# Patient Record
Sex: Female | Born: 1970 | Race: Black or African American | Hispanic: No | Marital: Single | Smoking: Never smoker
Health system: Southern US, Community
[De-identification: ages and names within clinical notes are randomized; demographics above are authoritative.]

## PROBLEM LIST (undated history)

## (undated) DIAGNOSIS — M419 Scoliosis, unspecified: Secondary | ICD-10-CM

## (undated) DIAGNOSIS — R011 Cardiac murmur, unspecified: Secondary | ICD-10-CM

## (undated) DIAGNOSIS — G808 Other cerebral palsy: Secondary | ICD-10-CM

---

## 1998-03-16 ENCOUNTER — Encounter (HOSPITAL_COMMUNITY): Admission: RE | Admit: 1998-03-16 | Discharge: 1998-06-14 | Payer: Self-pay | Admitting: Family Medicine

## 2000-07-13 ENCOUNTER — Ambulatory Visit (HOSPITAL_COMMUNITY): Admission: RE | Admit: 2000-07-13 | Discharge: 2000-07-13 | Payer: Self-pay | Admitting: Family Medicine

## 2000-07-13 ENCOUNTER — Encounter: Payer: Self-pay | Admitting: Family Medicine

## 2004-12-17 ENCOUNTER — Emergency Department (HOSPITAL_COMMUNITY): Admission: EM | Admit: 2004-12-17 | Discharge: 2004-12-17 | Payer: Self-pay | Admitting: Emergency Medicine

## 2007-12-19 ENCOUNTER — Encounter: Admission: RE | Admit: 2007-12-19 | Discharge: 2007-12-19 | Payer: Self-pay | Admitting: Family Medicine

## 2012-01-23 ENCOUNTER — Ambulatory Visit (HOSPITAL_COMMUNITY)
Admission: RE | Admit: 2012-01-23 | Discharge: 2012-01-23 | Disposition: A | Discharge status: Home / Self Care | Payer: Medicaid Other | Source: Ambulatory Visit | Attending: Family Medicine | Admitting: Family Medicine

## 2012-01-23 DIAGNOSIS — R011 Cardiac murmur, unspecified: Secondary | ICD-10-CM

## 2012-01-23 DIAGNOSIS — G809 Cerebral palsy, unspecified: Secondary | ICD-10-CM | POA: Insufficient documentation

## 2012-01-23 NOTE — Progress Notes (Signed)
*  PRELIMINARY RESULTS* Echocardiogram 2D Echocardiogram has been performed.  Sheri Crosby 01/23/2012, 9:57 AM

## 2012-08-13 ENCOUNTER — Emergency Department (HOSPITAL_COMMUNITY)
Admission: EM | Admit: 2012-08-13 | Discharge: 2012-08-14 | Disposition: A | Discharge status: Home / Self Care | Payer: Medicaid Other | Attending: Emergency Medicine | Admitting: Emergency Medicine

## 2012-08-13 ENCOUNTER — Emergency Department (HOSPITAL_COMMUNITY): Payer: Medicaid Other

## 2012-08-13 ENCOUNTER — Encounter (HOSPITAL_COMMUNITY): Payer: Self-pay | Admitting: *Deleted

## 2012-08-13 DIAGNOSIS — Z79899 Other long term (current) drug therapy: Secondary | ICD-10-CM | POA: Insufficient documentation

## 2012-08-13 DIAGNOSIS — S8011XA Contusion of right lower leg, initial encounter: Secondary | ICD-10-CM

## 2012-08-13 DIAGNOSIS — M4120 Other idiopathic scoliosis, site unspecified: Secondary | ICD-10-CM | POA: Insufficient documentation

## 2012-08-13 DIAGNOSIS — S8010XA Contusion of unspecified lower leg, initial encounter: Secondary | ICD-10-CM | POA: Insufficient documentation

## 2012-08-13 DIAGNOSIS — Y929 Unspecified place or not applicable: Secondary | ICD-10-CM | POA: Insufficient documentation

## 2012-08-13 DIAGNOSIS — Y9389 Activity, other specified: Secondary | ICD-10-CM | POA: Insufficient documentation

## 2012-08-13 DIAGNOSIS — Z8669 Personal history of other diseases of the nervous system and sense organs: Secondary | ICD-10-CM | POA: Insufficient documentation

## 2012-08-13 DIAGNOSIS — W1789XA Other fall from one level to another, initial encounter: Secondary | ICD-10-CM | POA: Insufficient documentation

## 2012-08-13 HISTORY — DX: Other cerebral palsy: G80.8

## 2012-08-13 HISTORY — DX: Cardiac murmur, unspecified: R01.1

## 2012-08-13 HISTORY — DX: Scoliosis, unspecified: M41.9

## 2012-08-13 MED ORDER — HYDROCODONE-ACETAMINOPHEN 5-325 MG PO TABS: 2.0000 | ORAL_TABLET | ORAL | Status: AC | PRN

## 2012-08-13 MED ORDER — HYDROCODONE-ACETAMINOPHEN 5-325 MG PO TABS
1.0000 | ORAL_TABLET | Freq: Once | ORAL | Status: AC
  Administered 2012-08-14: 1 via ORAL
  Filled 2012-08-13: qty 1

## 2012-08-13 NOTE — ED Notes (Signed)
Pt in wheelchair coming down a slope and her chair flipped with her secured in it; wheelchair landed with right leg/foot under chair; c/o right leg/foot pain

## 2012-08-13 NOTE — ED Provider Notes (Signed)
History     CSN: [id number]  Arrival date & time 08/13/12  2209   First MD Initiated Contact with Patient 08/13/12 2243      Chief Complaint  Patient presents with  . Fall    (Consider location/radiation/quality/duration/timing/severity/associated sxs/prior treatment) HPI Comments: This is a 41 year old female, who has a history of cerebral palsy with paraplegia was riding in her electric wheelchair.  When the wheel became stuck in the crevice in the cement, throwing it to the side landing on her left lower leg.  She now has pain in the tib-fib area and dorsal aspect of her left foot, without deformity.  She has not taken any over-the-counter medication nor has any prescribed medication for pain  Patient is a 41 y.o. female presenting with fall. The history is provided by the patient.  Fall The accident occurred 1 to 2 hours ago. She fell from a height of 1 to 2 ft. She landed on concrete. Pertinent negatives include no fever, no numbness and no headaches.    Past Medical History  Diagnosis Date  . Cerebral palsy, quadriplegic   . Scoliosis   . Heart murmur     History reviewed. No pertinent past surgical history.  No family history on file.  History  Substance Use Topics  . Smoking status: Never Smoker   . Smokeless tobacco: Not on file  . Alcohol Use: No    OB History    Grav Para Term Preterm Abortions TAB SAB Ect Mult Living                  Review of Systems  Constitutional: Negative for fever and chills.  Musculoskeletal: Positive for joint swelling.  Skin: Negative for wound.  Neurological: Negative for dizziness, weakness, numbness and headaches.    Allergies  Sulfa antibiotics and Trimethoprim  Home Medications   Current Outpatient Rx  Name Route Sig Dispense Refill  . CHLORHEXIDINE GLUCONATE 0.12 % MT SOLN Mouth/Throat Use as directed 5 mLs in the mouth or throat at bedtime. Brush mouth with 5 ml as directed at bedtime    . LEVONORGEST-ETH  ESTRAD 91-DAY 0.15-0.03 MG PO TABS Oral Take 1 tablet by mouth daily.    . CERTAGEN PO TABS Oral Take 1 tablet by mouth daily.    . OLOPATADINE HCL 0.2 % OP SOLN Ophthalmic Apply 1 drop to eye every morning.    Marland Kitchen PILOCARPINE HCL 1 % OP SOLN Both Eyes Place 1 drop into both eyes 3 (three) times daily.    . TRAVOPROST 0.004 % OP SOLN Both Eyes Place 1 drop into both eyes at bedtime.    Marland Kitchen HYDROCODONE-ACETAMINOPHEN 5-325 MG PO TABS Oral Take 2 tablets by mouth every 4 (four) hours as needed for pain. 10 tablet 0    BP 147/97  Pulse 76  Temp 99.1 F (37.3 C) (Oral)  Resp 20  SpO2 99%  Physical Exam  Constitutional: She appears well-developed.  HENT:  Head: Normocephalic.  Eyes: Pupils are equal, round, and reactive to light.  Cardiovascular: Normal rate.   Pulmonary/Chest: Effort normal.  Musculoskeletal: She exhibits edema and tenderness.       Right ankle: She exhibits swelling. She exhibits no ecchymosis and normal pulse. tenderness.  Skin: Skin is warm. No erythema.    ED Course  Procedures (including critical care time)  Labs Reviewed - No data to display Dg Tibia/fibula Right  08/13/2012  *RADIOLOGY REPORT*  Clinical Data: Fall, pain.  RIGHT TIBIA AND FIBULA -  2 VIEW  Comparison: None.  Findings: There is no acute bony or joint abnormality.  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  Soft tissue structures are unremarkable.  IMPRESSION: No acute finding.   Original Report Authenticated By: Bernadene Bell. D'ALESSIO, M.D.    Dg Foot Complete Right  08/13/2012  *RADIOLOGY REPORT*  Clinical Data: Fall from wheelchair.  Pain.  RIGHT FOOT COMPLETE - 3+ VIEW  Comparison: None.  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  No acute osseous abnormality is present.  Soft tissue swelling is present over the dorsum of the foot.  IMPRESSION:  1.  Soft tissue swelling over the dorsum of the foot without an acute abnormality.   Original Report  Authenticated By: Jamesetta Orleans. MATTERN, M.D.      1. Contusion of right lower extremity       MDM  Hard to tell if there is a deformity due to patients atropic extremities         Arman Filter, NP 08/13/12 2350  Arman Filter, NP 08/13/12 2350

## 2012-08-14 NOTE — ED Provider Notes (Signed)
Medical screening examination/treatment/procedure(s) were performed by non-physician practitioner and as supervising physician I was immediately available for consultation/collaboration.  Javonne Dorko, MD 08/14/12 0002 

## 2012-12-09 ENCOUNTER — Other Ambulatory Visit: Payer: Self-pay | Admitting: Family Medicine

## 2012-12-09 DIAGNOSIS — Z1231 Encounter for screening mammogram for malignant neoplasm of breast: Secondary | ICD-10-CM

## 2013-02-07 ENCOUNTER — Ambulatory Visit
Admission: RE | Admit: 2013-02-07 | Discharge: 2013-02-07 | Disposition: A | Discharge status: Home / Self Care | Payer: Medicaid Other | Source: Ambulatory Visit | Attending: Family Medicine | Admitting: Family Medicine

## 2013-02-07 DIAGNOSIS — Z1231 Encounter for screening mammogram for malignant neoplasm of breast: Secondary | ICD-10-CM

## 2013-12-05 ENCOUNTER — Other Ambulatory Visit: Payer: Self-pay

## 2013-12-05 DIAGNOSIS — Z1231 Encounter for screening mammogram for malignant neoplasm of breast: Secondary | ICD-10-CM

## 2014-02-09 ENCOUNTER — Ambulatory Visit
Admission: RE | Admit: 2014-02-09 | Discharge: 2014-02-09 | Disposition: A | Discharge status: Home / Self Care | Payer: Self-pay | Source: Ambulatory Visit

## 2014-02-09 ENCOUNTER — Encounter (INDEPENDENT_AMBULATORY_CARE_PROVIDER_SITE_OTHER): Payer: Self-pay

## 2014-02-09 DIAGNOSIS — Z1231 Encounter for screening mammogram for malignant neoplasm of breast: Secondary | ICD-10-CM

## 2015-01-26 ENCOUNTER — Other Ambulatory Visit: Payer: Self-pay

## 2015-01-26 DIAGNOSIS — Z1231 Encounter for screening mammogram for malignant neoplasm of breast: Secondary | ICD-10-CM

## 2015-02-17 ENCOUNTER — Ambulatory Visit
Admission: RE | Admit: 2015-02-17 | Discharge: 2015-02-17 | Disposition: A | Discharge status: Home / Self Care | Payer: Medicaid Other | Source: Ambulatory Visit

## 2015-02-17 DIAGNOSIS — Z1231 Encounter for screening mammogram for malignant neoplasm of breast: Secondary | ICD-10-CM

## 2016-02-28 ENCOUNTER — Other Ambulatory Visit: Payer: Self-pay | Admitting: Family Medicine

## 2016-02-28 DIAGNOSIS — Z1231 Encounter for screening mammogram for malignant neoplasm of breast: Secondary | ICD-10-CM

## 2016-03-14 ENCOUNTER — Ambulatory Visit: Payer: Medicaid Other

## 2016-05-03 ENCOUNTER — Ambulatory Visit
Admission: RE | Admit: 2016-05-03 | Discharge: 2016-05-03 | Disposition: A | Discharge status: Home / Self Care | Payer: Medicaid Other | Source: Ambulatory Visit | Attending: Family Medicine | Admitting: Family Medicine

## 2016-05-03 DIAGNOSIS — Z1231 Encounter for screening mammogram for malignant neoplasm of breast: Secondary | ICD-10-CM

## 2017-04-27 ENCOUNTER — Other Ambulatory Visit: Payer: Self-pay | Admitting: Family Medicine

## 2017-04-27 DIAGNOSIS — Z1231 Encounter for screening mammogram for malignant neoplasm of breast: Secondary | ICD-10-CM

## 2017-05-15 ENCOUNTER — Ambulatory Visit: Payer: Medicaid Other

## 2017-05-16 ENCOUNTER — Ambulatory Visit
Admission: RE | Admit: 2017-05-16 | Discharge: 2017-05-16 | Disposition: A | Discharge status: Home / Self Care | Payer: Medicaid Other | Source: Ambulatory Visit | Attending: Family Medicine | Admitting: Family Medicine

## 2017-05-16 DIAGNOSIS — Z1231 Encounter for screening mammogram for malignant neoplasm of breast: Secondary | ICD-10-CM

## 2018-05-23 ENCOUNTER — Other Ambulatory Visit: Payer: Self-pay | Admitting: Family Medicine

## 2018-05-23 DIAGNOSIS — Z1231 Encounter for screening mammogram for malignant neoplasm of breast: Secondary | ICD-10-CM

## 2018-06-25 ENCOUNTER — Ambulatory Visit
Admission: RE | Admit: 2018-06-25 | Discharge: 2018-06-25 | Disposition: A | Discharge status: Home / Self Care | Payer: Medicaid Other | Source: Ambulatory Visit | Attending: Family Medicine | Admitting: Family Medicine

## 2018-06-25 DIAGNOSIS — Z1231 Encounter for screening mammogram for malignant neoplasm of breast: Secondary | ICD-10-CM

## 2019-11-04 ENCOUNTER — Other Ambulatory Visit: Payer: Self-pay | Admitting: Family Medicine

## 2019-11-04 DIAGNOSIS — Z1231 Encounter for screening mammogram for malignant neoplasm of breast: Secondary | ICD-10-CM

## 2019-12-17 ENCOUNTER — Ambulatory Visit
Admission: RE | Admit: 2019-12-17 | Discharge: 2019-12-17 | Disposition: A | Discharge status: Home / Self Care | Payer: Medicaid Other | Source: Ambulatory Visit | Attending: Family Medicine | Admitting: Family Medicine

## 2019-12-17 ENCOUNTER — Other Ambulatory Visit: Payer: Self-pay

## 2019-12-17 DIAGNOSIS — Z1231 Encounter for screening mammogram for malignant neoplasm of breast: Secondary | ICD-10-CM

## 2020-09-15 ENCOUNTER — Emergency Department (HOSPITAL_COMMUNITY)
Admission: EM | Admit: 2020-09-15 | Discharge: 2020-09-16 | Disposition: A | Discharge status: Home / Self Care | Payer: Medicaid Other | Attending: Emergency Medicine | Admitting: Emergency Medicine

## 2020-09-15 ENCOUNTER — Emergency Department (HOSPITAL_COMMUNITY): Payer: Medicaid Other

## 2020-09-15 DIAGNOSIS — J029 Acute pharyngitis, unspecified: Secondary | ICD-10-CM | POA: Diagnosis not present

## 2020-09-15 DIAGNOSIS — R1030 Lower abdominal pain, unspecified: Secondary | ICD-10-CM | POA: Diagnosis present

## 2020-09-15 DIAGNOSIS — N390 Urinary tract infection, site not specified: Secondary | ICD-10-CM | POA: Insufficient documentation

## 2020-09-15 LAB — COMPREHENSIVE METABOLIC PANEL
ALT: 26 U/L (ref 0–44)
AST: 28 U/L (ref 15–41)
Albumin: 4.8 g/dL (ref 3.5–5.0)
Alkaline Phosphatase: 94 U/L (ref 38–126)
Anion gap: 13 (ref 5–15)
BUN: 9 mg/dL (ref 6–20)
CO2: 27 mmol/L (ref 22–32)
Calcium: 9.5 mg/dL (ref 8.9–10.3)
Chloride: 99 mmol/L (ref 98–111)
Creatinine, Ser: 0.55 mg/dL (ref 0.44–1.00)
GFR, Estimated: >60 mL/min (ref >60)
Glucose, Bld: 117 mg/dL — ABNORMAL HIGH (ref 70–99)
Potassium: 3.1 mmol/L — ABNORMAL LOW (ref 3.5–5.1)
Sodium: 139 mmol/L (ref 135–145)
Total Bilirubin: 1 mg/dL (ref 0.3–1.2)
Total Protein: 8.5 g/dL — ABNORMAL HIGH (ref 6.5–8.1)

## 2020-09-15 LAB — CBC WITH DIFFERENTIAL/PLATELET
Abs Immature Granulocytes: 0.01 10*3/uL (ref 0.00–0.07)
Basophils Absolute: 0 10*3/uL (ref 0.0–0.1)
Basophils Relative: 1 %
Eosinophils Absolute: 0.1 10*3/uL (ref 0.0–0.5)
Eosinophils Relative: 1 %
HCT: 40.5 % (ref 36.0–46.0)
Hemoglobin: 14.1 g/dL (ref 12.0–15.0)
Immature Granulocytes: 0 %
Lymphocytes Relative: 28 %
Lymphs Abs: 1.6 10*3/uL (ref 0.7–4.0)
MCH: 30.7 pg (ref 26.0–34.0)
MCHC: 34.8 g/dL (ref 30.0–36.0)
MCV: 88.2 fL (ref 80.0–100.0)
Monocytes Absolute: 0.3 10*3/uL (ref 0.1–1.0)
Monocytes Relative: 5 %
Neutro Abs: 3.7 10*3/uL (ref 1.7–7.7)
Neutrophils Relative %: 65 %
Platelets: 161 10*3/uL (ref 150–400)
RBC: 4.59 MIL/uL (ref 3.87–5.11)
RDW: 12.2 % (ref 11.5–15.5)
WBC: 5.6 10*3/uL (ref 4.0–10.5)
nRBC: 0 % (ref 0.0–0.2)

## 2020-09-15 LAB — URINALYSIS, ROUTINE W REFLEX MICROSCOPIC
Bilirubin Urine: NEGATIVE
Glucose, UA: NEGATIVE mg/dL
Hgb urine dipstick: NEGATIVE
Ketones, ur: 20 mg/dL — AB
Leukocytes,Ua: NEGATIVE
Nitrite: POSITIVE — AB
Protein, ur: NEGATIVE mg/dL
Specific Gravity, Urine: 1.01 (ref 1.005–1.030)
pH: 7 (ref 5.0–8.0)

## 2020-09-15 LAB — LIPASE, BLOOD: Lipase: 36 U/L (ref 11–51)

## 2020-09-15 MED ORDER — CEPHALEXIN 500 MG PO CAPS
500.0000 mg | ORAL_CAPSULE | Freq: Once | ORAL | Status: AC
  Administered 2020-09-16: 500 mg via ORAL
  Filled 2020-09-15: qty 1

## 2020-09-15 MED ORDER — CEPHALEXIN 500 MG PO CAPS: 500.0000 mg | ORAL_CAPSULE | Freq: Four times a day (QID) | ORAL | 0 refills | Status: AC

## 2020-09-15 NOTE — Discharge Instructions (Signed)
If you develop fever, vomiting, confusion or other concerns return to the ER.

## 2020-09-15 NOTE — ED Provider Notes (Signed)
[facility]-EMERGENCY DEPT Provider Note   CSN: [id number] Arrival date & time: 09/15/20  1933     History Chief Complaint  Patient presents with  . Abdominal Pain    Sheri Crosby is a 49 y.o. female.  Patient is a 49 year old female with a history of CP, quadriplegic, heart murmur and scoliosis who presents today from her group home with a complaint of 1 week of sore throat, cough, congestion and ongoing abdominal pain.  She denies any dysuria but also thinks she has been running a fever.  She has no shortness of breath at this time and denies any nausea or vomiting.  She denies any diarrhea.  She thinks that maybe eating makes the pain worse but she is not sure.  She has had no recent medication changes.  She has not been around anyone who has been ill.  Group home reported that when she had the symptoms everybody was tested for Covid and they all came back negative yesterday.  Because of the ongoing abdominal pain they sent her here for evaluation.  The history is provided by the patient.  Abdominal Pain      Past Medical History:  Diagnosis Date  . Cerebral palsy, quadriplegic (HCC)   . Heart murmur   . Scoliosis     There are no problems to display for this patient.   No past surgical history on file.   OB History   No obstetric history on file.     No family history on file.  Social History   Tobacco Use  . Smoking status: Never Smoker  Substance Use Topics  . Alcohol use: No  . Drug use: Not on file    Home Medications Prior to Admission medications   Medication Sig Start Date End Date Taking? Authorizing Provider  chlorhexidine (PERIDEX) 0.12 % solution Use as directed 5 mLs in the mouth or throat at bedtime. Brush mouth with 5 ml as directed at bedtime    [provider]  HYDROcodone-acetaminophen (NORCO/VICODIN) 5-325 MG per tablet Take 2 tablets by mouth every 4 (four) hours as needed for pain. 08/13/12   Earley Favor,  NP  levonorgestrel-ethinyl estradiol (SEASONALE,INTROVALE,JOLESSA) 0.15-0.03 MG tablet Take 1 tablet by mouth daily.    [provider]  Multiple Vitamins-Minerals (CERTAGEN) tablet Take 1 tablet by mouth daily.    [provider]  Olopatadine HCl (PATADAY) 0.2 % SOLN Apply 1 drop to eye every morning.    [provider]  pilocarpine (PILOCAR) 1 % ophthalmic solution Place 1 drop into both eyes 3 (three) times daily.    [provider]  travoprost, benzalkonium, (TRAVATAN) 0.004 % ophthalmic solution Place 1 drop into both eyes at bedtime.    [provider]    Allergies    Sulfa antibiotics and Trimethoprim  Review of Systems   Review of Systems  Gastrointestinal: Positive for abdominal pain.  All other systems reviewed and are negative.   Physical Exam Updated Vital Signs BP (!) 178/117   Pulse 82   Temp 98.1 F (36.7 C) (Oral)   Resp 18   SpO2 97%   Physical Exam Vitals and nursing note reviewed.  Constitutional:      General: She is not in acute distress.    Appearance: She is well-developed.  HENT:     Head: Normocephalic and atraumatic.     Right Ear: Tympanic membrane normal.     Left Ear: Tympanic membrane normal.  Mouth/Throat:     Mouth: Mucous membranes are moist.  Eyes:     Pupils: Pupils are equal, round, and reactive to light.  Cardiovascular:     Rate and Rhythm: Normal rate and regular rhythm.     Heart sounds: Normal heart sounds. No murmur heard.  No friction rub.  Pulmonary:     Effort: Pulmonary effort is normal.     Breath sounds: Normal breath sounds. No wheezing or rales.  Abdominal:     General: Bowel sounds are normal. There is no distension.     Palpations: Abdomen is soft.     Tenderness: There is abdominal tenderness in the suprapubic area. There is no right CVA tenderness, left CVA tenderness, guarding or rebound.  Musculoskeletal:        General: No tenderness. Normal range of motion.      Cervical back: Normal range of motion and neck supple.     Comments: No edema  Skin:    General: Skin is warm and dry.     Capillary Refill: Capillary refill takes less than 2 seconds.     Findings: No rash.  Neurological:     Mental Status: She is alert and oriented to person, place, and time.     Cranial Nerves: No cranial nerve deficit.     Comments: Lateral lower extremity contractures without wounds present.  Minimal contractures of bilateral upper extremities  Psychiatric:        Mood and Affect: Mood normal.        Behavior: Behavior normal.        Thought Content: Thought content normal.     ED Results / Procedures / Treatments   Labs (all labs ordered are listed, but only abnormal results are displayed) Labs Reviewed  COMPREHENSIVE METABOLIC PANEL - Abnormal; Notable for the following components:      Result Value   Potassium 3.1 (*)    Glucose, Bld 117 (*)    Total Protein 8.5 (*)    All other components within normal limits  URINALYSIS, ROUTINE W REFLEX MICROSCOPIC - Abnormal; Notable for the following components:   APPearance HAZY (*)    Ketones, ur 20 (*)    Nitrite POSITIVE (*)    Bacteria, UA MANY (*)    All other components within normal limits  URINE CULTURE  CBC WITH DIFFERENTIAL/PLATELET  LIPASE, BLOOD    EKG None  Radiology DG Chest 2 View  Result Date: 09/15/2020 CLINICAL DATA:  Cough and right upper quadrant pain EXAM: CHEST - 2 VIEW COMPARISON:  12/19/2007 FINDINGS: Cardiac shadow is stable. Lungs are hypoinflated without focal infiltrate or sizable effusion. No acute bony abnormality is noted. Visualized abdomen is within normal limits. IMPRESSION: No acute abnormality noted. Electronically Signed   By: Alcide Clever M.D.   On: 09/15/2020 21:01    Procedures Procedures (including critical care time)  Medications Ordered in ED Medications - No data to display  ED Course  I have reviewed the triage vital signs and the nursing  notes.  Pertinent labs & imaging results that were available during my care of the patient were reviewed by me and considered in my medical decision making (see chart for details).    MDM Rules/Calculators/A&P                          49 year old female presenting today with multiple vague symptoms including URI symptoms that then also progressed to abdominal pain.  She  reports that she has been having fever but is afebrile here with reassuring vital signs.  She has no shortness of breath and sats are 97% on room air.  On exam abdomen is soft but she does have some suprapubic pain.  There is no focal right lower quadrant pain concerning for appendicitis.  Patient was tested yesterday for Covid and was negative.  Will check labs to ensure no UTI, pneumonia, hepatitis or pancreatitis.  Patient has still been eating at home.  11:50 PM UA with positive nitrites, 6-10 white blood cells and many bacteria.  CBC within normal limits, CMP within normal limits and lipase within normal limits.  Chest x-ray within normal limits.  Patient has been hypertensive here and reports she does have a history of hypertension and does take blood pressure medication in the morning.  Most recent blood pressure is 160/114.  Instructed patient to take her blood pressure medicine and continue to monitoring it and if not improving she needs to see her PCP for medication adjustment.  MDM Number of Diagnoses or Management Options   Amount and/or Complexity of Data Reviewed Clinical lab tests: ordered and reviewed Tests in the radiology section of CPT: ordered and reviewed Decide to obtain previous medical records or to obtain history from someone other than the patient: yes Obtain history from someone other than the patient: yes Review and summarize past medical records: yes Independent visualization of images, tracings, or specimens: yes  Risk of Complications, Morbidity, and/or Mortality Presenting problems:  moderate Diagnostic procedures: low Management options: low  Patient Progress Patient progress: stable    Final Clinical Impression(s) / ED Diagnoses Final diagnoses:  Lower urinary tract infectious disease    Rx / DC Orders ED Discharge Orders         Ordered    cephALEXin (KEFLEX) 500 MG capsule  4 times daily        09/15/20 2341           Gwyneth Sprout, MD 09/15/20 2352

## 2020-09-15 NOTE — ED Triage Notes (Signed)
Patient BIB EMS for complaint of RLQ abdominal pain and hypertension. Patient has history of cerebral palsy and HTN. Patient denies N/V/D. Patient is AxOx4, appears to be in NAD.

## 2020-09-18 LAB — URINE CULTURE: Culture: >=100000 — AB

## 2020-09-19 ENCOUNTER — Telehealth (HOSPITAL_BASED_OUTPATIENT_CLINIC_OR_DEPARTMENT_OTHER): Payer: Self-pay | Admitting: Emergency Medicine

## 2020-09-19 NOTE — Telephone Encounter (Signed)
Post ED Visit - Positive Culture Follow-up  Culture report reviewed by antimicrobial stewardship pharmacist: [facility] []  Nathan Batchelder, Pharm.D. []  , Pharm.D., BCPS AQ-ID []  , Pharm.D., BCPS []  Celedonio Miyamoto, .D., BCPS []  [place], .D., BCPS, AAHIVP []  Georgina Pillion, Pharm.D., BCPS, AAHIVP []  [address], PharmD, BCPS []  , PharmD, BCPS []  [place], PharmD, BCPS []  [place], PharmD []  , PharmD, BCPS []  Estella Husk, PharmD  Pharmacy Team [x]  Lysle Pearl, PharmD []  , PharmD []  Phillips Climes, PharmD []  , Rph []  Agapito Games) , PharmD []  Verlan Friends, PharmD []  , PharmD []  Mervyn Gay, PharmD []  , PharmD []  Vinnie Level, PharmD []  [facility], PharmD []  , PharmD []  Len Childs, PharmD   Positive urine culture Treated with Cephalexin, organism sensitive to the same and no further patient follow-up is required at this time.  Sheri Crosby 09/19/2020, 10:26 AM

## 2021-05-26 ENCOUNTER — Ambulatory Visit
Admission: RE | Admit: 2021-05-26 | Discharge: 2021-05-26 | Disposition: A | Discharge status: Home / Self Care | Payer: Medicaid Other | Source: Ambulatory Visit | Attending: Family Medicine | Admitting: Family Medicine

## 2021-05-26 ENCOUNTER — Other Ambulatory Visit: Payer: Self-pay | Admitting: Family Medicine

## 2021-05-26 DIAGNOSIS — M62461 Contracture of muscle, right lower leg: Secondary | ICD-10-CM

## 2021-05-26 DIAGNOSIS — M62462 Contracture of muscle, left lower leg: Secondary | ICD-10-CM

## 2021-06-30 ENCOUNTER — Other Ambulatory Visit: Payer: Self-pay | Admitting: Family Medicine

## 2021-06-30 DIAGNOSIS — Z1231 Encounter for screening mammogram for malignant neoplasm of breast: Secondary | ICD-10-CM

## 2021-08-08 ENCOUNTER — Ambulatory Visit: Payer: Medicaid Other

## 2021-09-26 ENCOUNTER — Ambulatory Visit
Admission: RE | Admit: 2021-09-26 | Discharge: 2021-09-26 | Disposition: A | Discharge status: Home / Self Care | Payer: Medicaid Other | Source: Ambulatory Visit | Attending: Family Medicine | Admitting: Family Medicine

## 2021-09-26 DIAGNOSIS — Z1231 Encounter for screening mammogram for malignant neoplasm of breast: Secondary | ICD-10-CM

## 2021-11-07 DIAGNOSIS — S73001A Unspecified subluxation of right hip, initial encounter: Secondary | ICD-10-CM | POA: Insufficient documentation

## 2021-11-07 DIAGNOSIS — Z9889 Other specified postprocedural states: Secondary | ICD-10-CM | POA: Insufficient documentation

## 2021-11-07 DIAGNOSIS — Q6589 Other specified congenital deformities of hip: Secondary | ICD-10-CM | POA: Insufficient documentation

## 2022-06-09 DIAGNOSIS — M1631 Unilateral osteoarthritis resulting from hip dysplasia, right hip: Secondary | ICD-10-CM | POA: Insufficient documentation

## 2022-08-22 ENCOUNTER — Other Ambulatory Visit: Payer: Self-pay | Admitting: Family Medicine

## 2022-08-22 DIAGNOSIS — Z1231 Encounter for screening mammogram for malignant neoplasm of breast: Secondary | ICD-10-CM

## 2022-10-24 ENCOUNTER — Ambulatory Visit: Payer: Medicaid Other

## 2022-12-27 ENCOUNTER — Other Ambulatory Visit: Payer: Self-pay | Admitting: Family Medicine

## 2022-12-27 DIAGNOSIS — M160 Bilateral primary osteoarthritis of hip: Secondary | ICD-10-CM

## 2022-12-29 DIAGNOSIS — G809 Cerebral palsy, unspecified: Secondary | ICD-10-CM | POA: Insufficient documentation

## 2023-01-03 ENCOUNTER — Ambulatory Visit (HOSPITAL_COMMUNITY)
Admission: RE | Admit: 2023-01-03 | Discharge: 2023-01-03 | Disposition: A | Discharge status: Home / Self Care | Payer: Medicaid Other | Source: Ambulatory Visit | Attending: Family Medicine | Admitting: Family Medicine

## 2023-01-03 DIAGNOSIS — M160 Bilateral primary osteoarthritis of hip: Secondary | ICD-10-CM | POA: Diagnosis not present

## 2023-02-14 DIAGNOSIS — D125 Benign neoplasm of sigmoid colon: Secondary | ICD-10-CM | POA: Insufficient documentation

## 2023-03-05 DIAGNOSIS — M25552 Pain in left hip: Secondary | ICD-10-CM | POA: Insufficient documentation

## 2023-03-21 IMAGING — MG MM DIGITAL SCREENING BILAT W/ TOMO AND CAD
8 series · 9 of 24 positions shown · non-contrast
Comparison: Previous exam(s).

CLINICAL DATA: Screening.

EXAM:
DIGITAL SCREENING BILATERAL MAMMOGRAM WITH TOMOSYNTHESIS AND CAD
TECHNIQUE: Bilateral screening digital craniocaudal and mediolateral oblique
mammograms were obtained. Bilateral screening digital breast
tomosynthesis was performed. The images were evaluated with
computer-aided detection.

[R CC synth-2D]
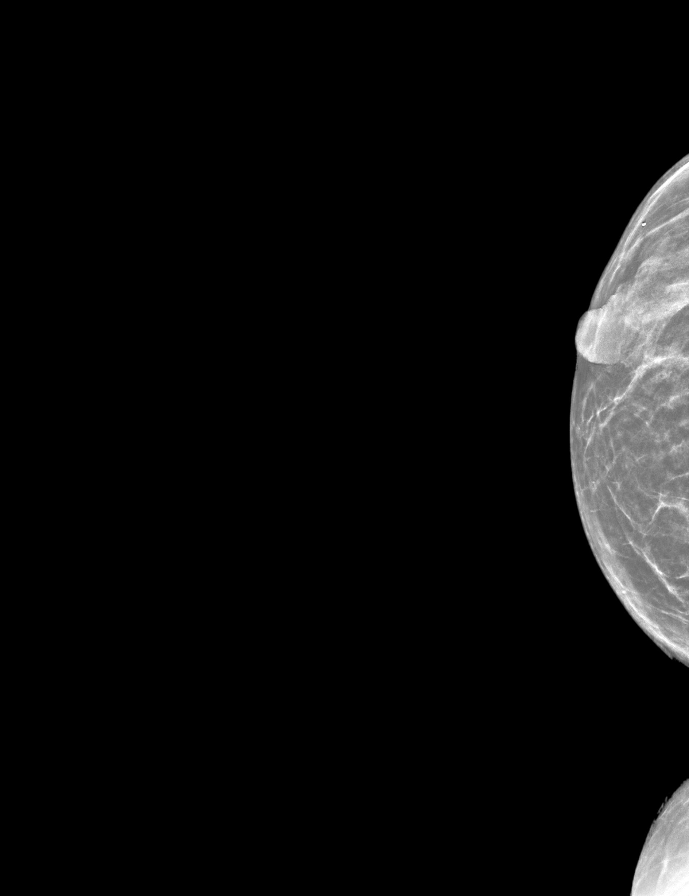

[R MLO synth-2D]
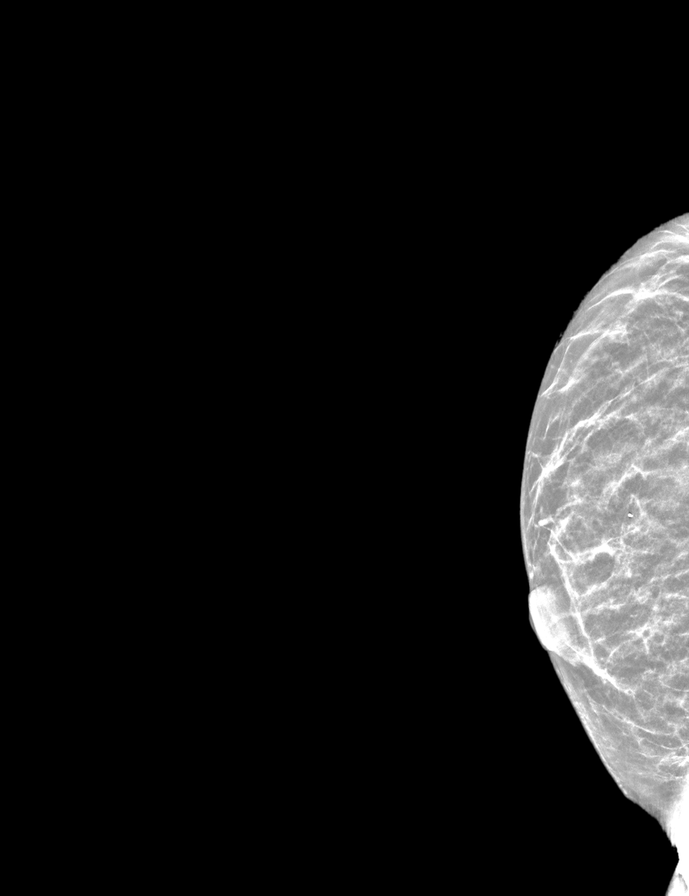

[L MLO synth-2D]
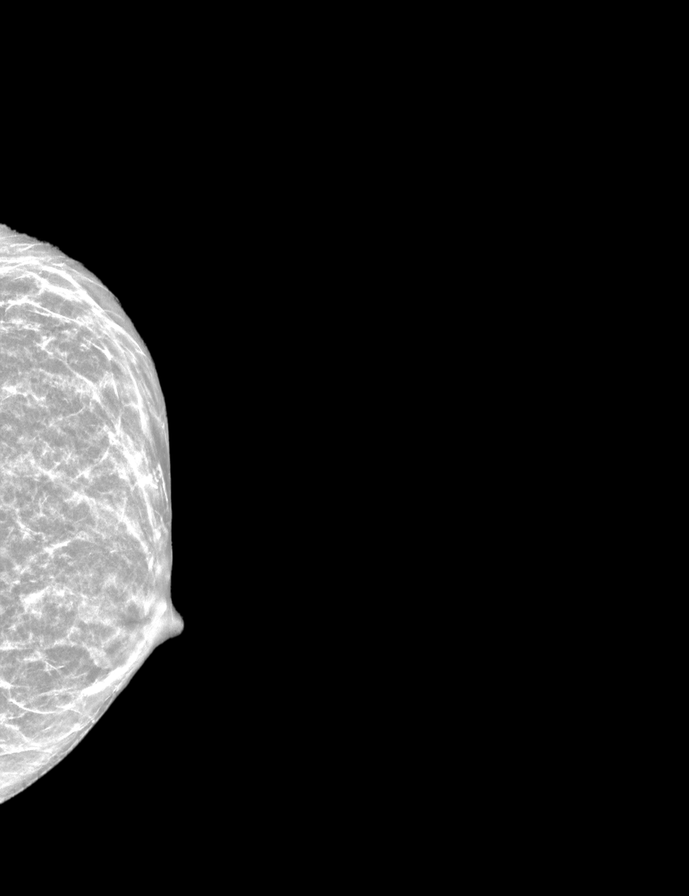

[L CC synth-2D]
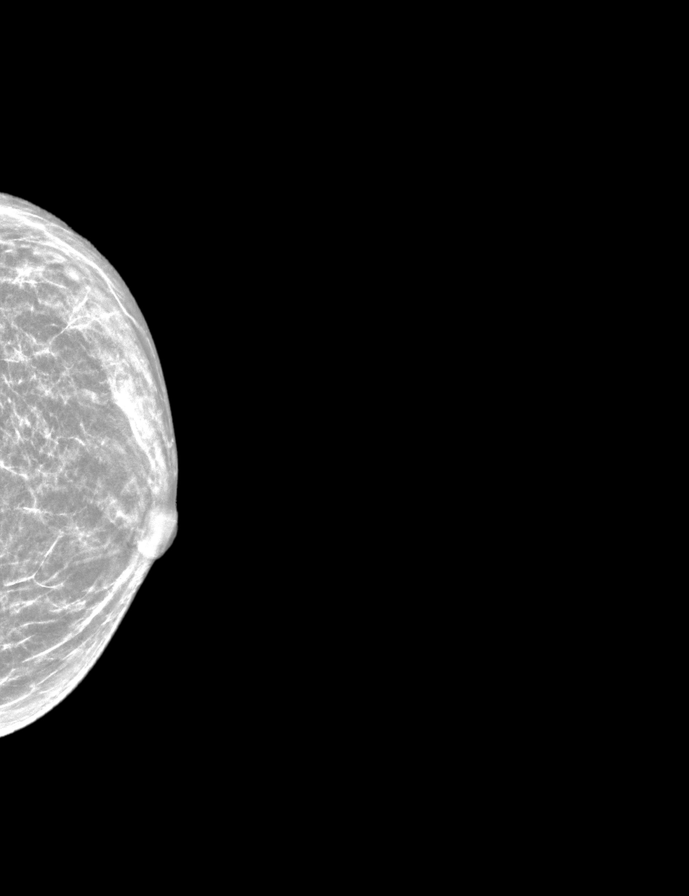

[L CC tomo · 2 of 48 frames shown]
[frame 16/48]
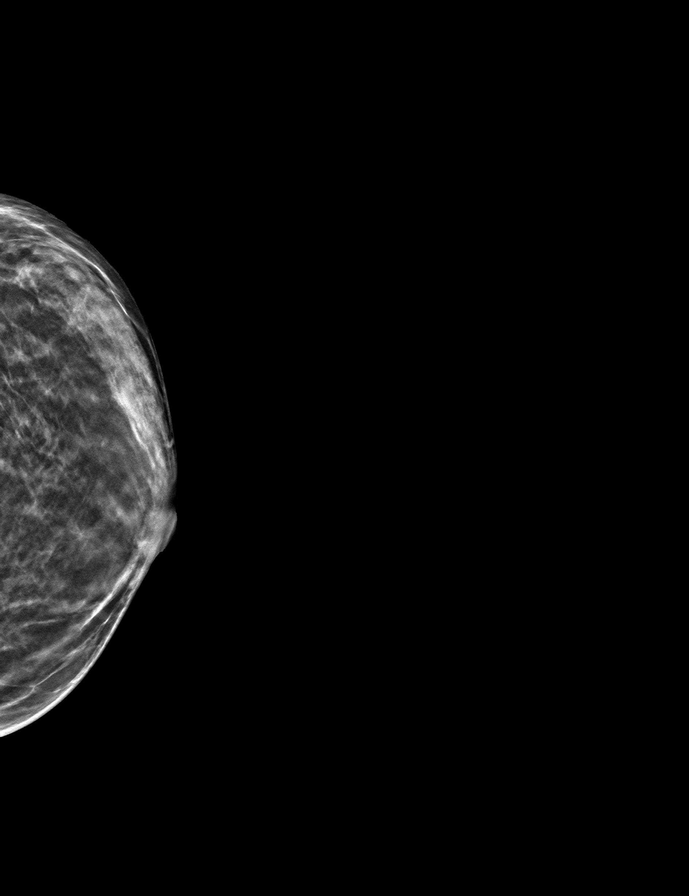
[frame 25/48]
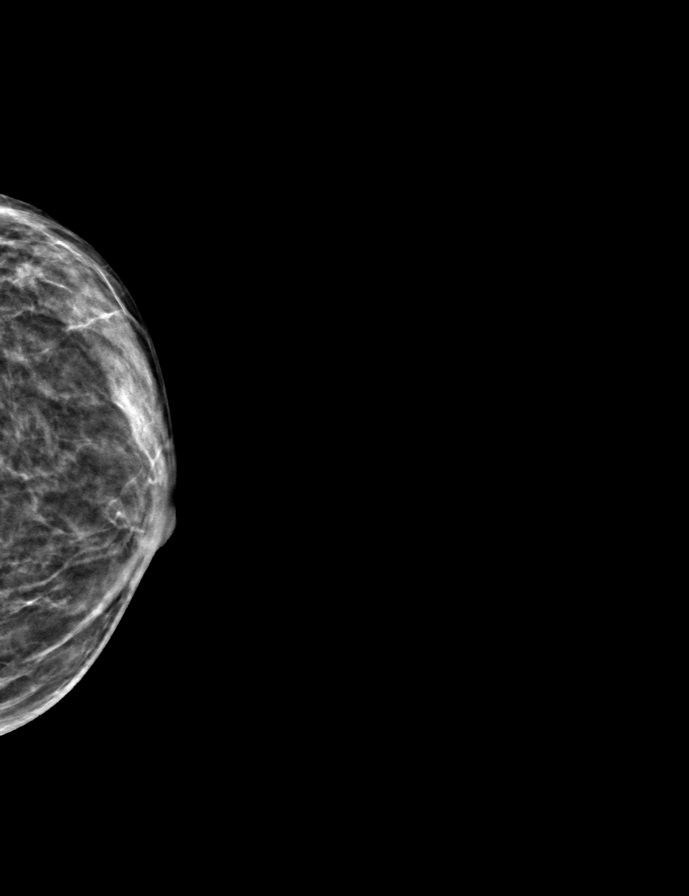

[R MLO tomo · tomo slice 21/42.0]
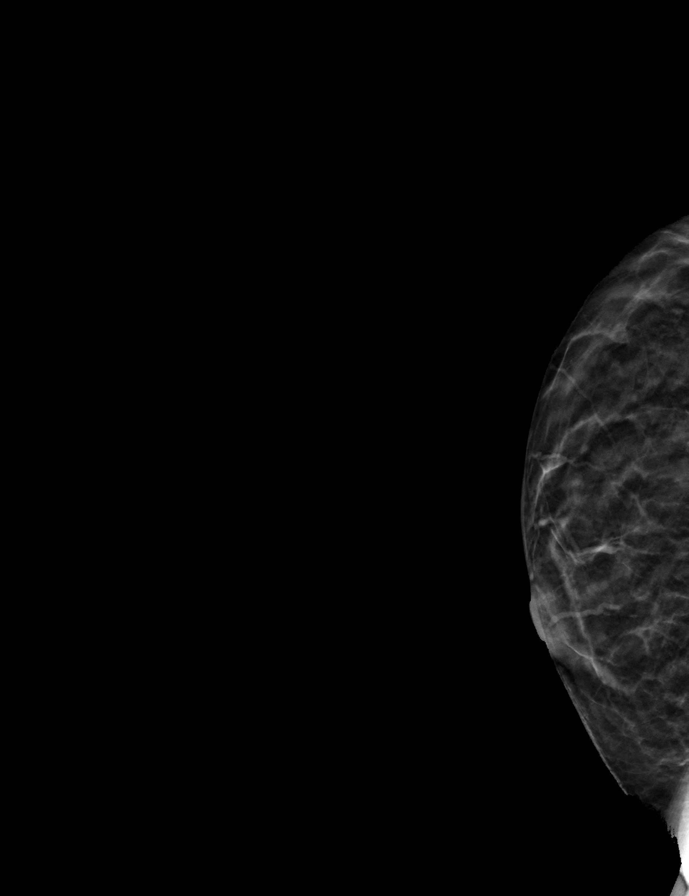

[R CC tomo · tomo slice 23/44.0]
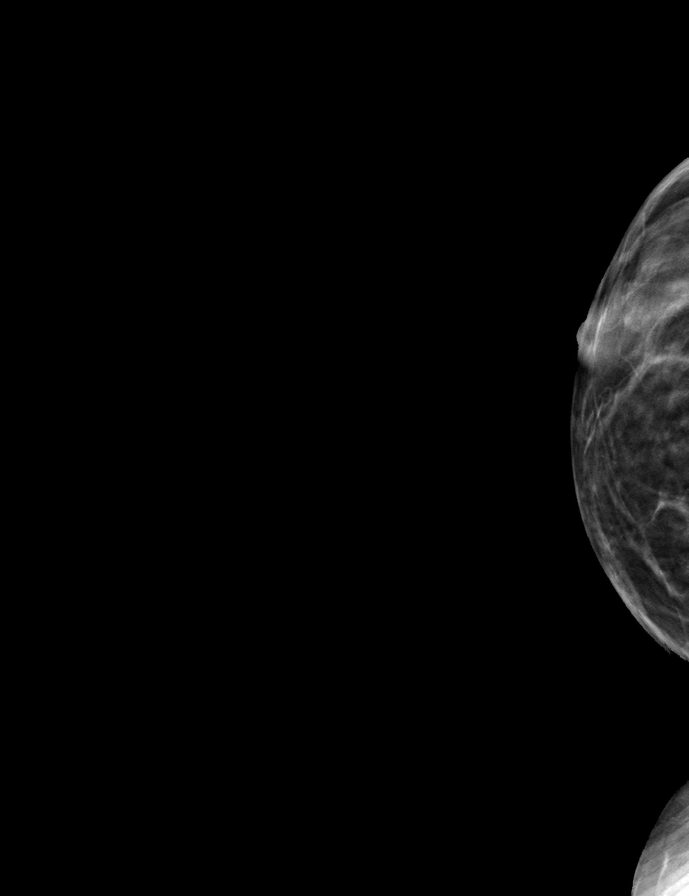

[L MLO tomo · tomo slice 25/49.0]
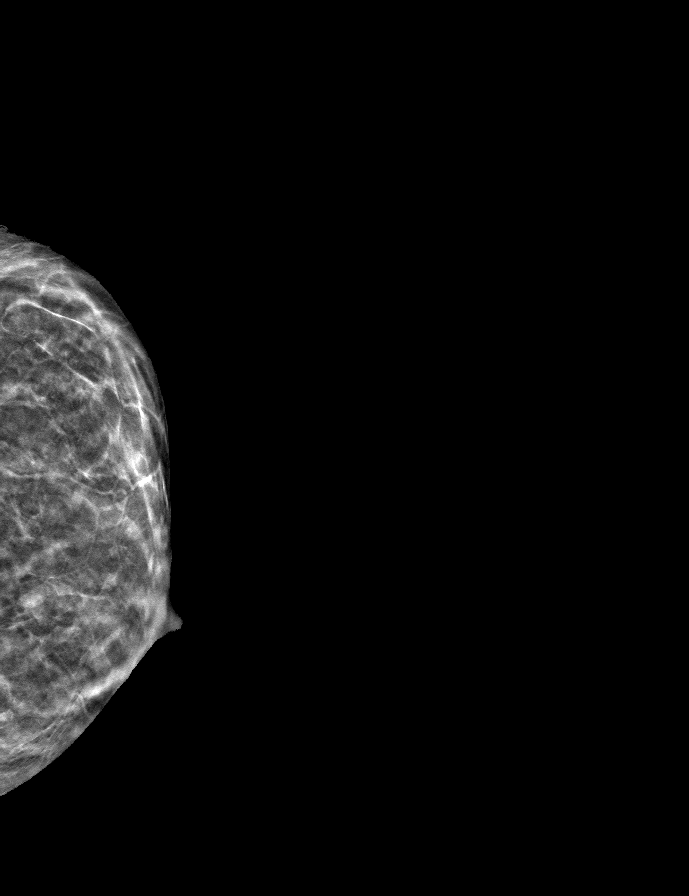

[9 of 24 positions shown; findings below may reference images not displayed]

ACR Breast Density Category c: The breast tissue is heterogeneously
dense, which may obscure small masses.
FINDINGS: Images are limited due to patient's limited mobility. Within these
limitations, there are no findings suspicious for malignancy.
IMPRESSION: No mammographic evidence of malignancy. A result letter of this
screening mammogram will be mailed directly to the patient.

RECOMMENDATION:
Screening mammogram in one year. (Code:U1-8-Y8T)

BI-RADS CATEGORY  1: Negative.

## 2023-04-09 ENCOUNTER — Other Ambulatory Visit: Payer: Self-pay | Admitting: Family Medicine

## 2023-04-09 DIAGNOSIS — Z1231 Encounter for screening mammogram for malignant neoplasm of breast: Secondary | ICD-10-CM

## 2023-05-14 DIAGNOSIS — M1612 Unilateral primary osteoarthritis, left hip: Secondary | ICD-10-CM | POA: Insufficient documentation

## 2023-10-01 ENCOUNTER — Ambulatory Visit
Admission: RE | Admit: 2023-10-01 | Discharge: 2023-10-01 | Disposition: A | Discharge status: Home / Self Care | Payer: MEDICAID | Source: Ambulatory Visit | Attending: Family Medicine | Admitting: Family Medicine

## 2023-10-01 DIAGNOSIS — Z1231 Encounter for screening mammogram for malignant neoplasm of breast: Secondary | ICD-10-CM

## 2023-11-14 ENCOUNTER — Encounter (HOSPITAL_BASED_OUTPATIENT_CLINIC_OR_DEPARTMENT_OTHER): Payer: MEDICAID | Attending: Internal Medicine | Admitting: Internal Medicine

## 2023-11-14 DIAGNOSIS — Z96642 Presence of left artificial hip joint: Secondary | ICD-10-CM | POA: Diagnosis not present

## 2023-11-14 DIAGNOSIS — G809 Cerebral palsy, unspecified: Secondary | ICD-10-CM | POA: Diagnosis present

## 2024-06-25 ENCOUNTER — Encounter: Payer: Self-pay | Admitting: Obstetrics and Gynecology

## 2024-06-25 ENCOUNTER — Encounter: Payer: MEDICAID | Admitting: Obstetrics and Gynecology

## 2024-09-18 ENCOUNTER — Encounter (HOSPITAL_BASED_OUTPATIENT_CLINIC_OR_DEPARTMENT_OTHER): Payer: MEDICAID | Attending: Internal Medicine | Admitting: Internal Medicine

## 2024-09-18 DIAGNOSIS — L89152 Pressure ulcer of sacral region, stage 2: Secondary | ICD-10-CM | POA: Insufficient documentation

## 2024-09-18 DIAGNOSIS — Z993 Dependence on wheelchair: Secondary | ICD-10-CM | POA: Insufficient documentation

## 2024-09-18 DIAGNOSIS — G808 Other cerebral palsy: Secondary | ICD-10-CM | POA: Insufficient documentation

## 2024-09-18 DIAGNOSIS — G809 Cerebral palsy, unspecified: Secondary | ICD-10-CM | POA: Diagnosis not present

## 2024-09-18 DIAGNOSIS — Z9889 Other specified postprocedural states: Secondary | ICD-10-CM | POA: Insufficient documentation
